# Patient Record
Sex: Female | Born: 1990 | Race: Black or African American | Hispanic: No | Marital: Single | Smoking: Never smoker
Health system: Southern US, Community
[De-identification: ages and names within clinical notes are randomized; demographics above are authoritative.]

## PROBLEM LIST (undated history)

## (undated) ENCOUNTER — Emergency Department (HOSPITAL_COMMUNITY): Admission: EM | Payer: Self-pay | Source: Home / Self Care

## (undated) DIAGNOSIS — F32A Depression, unspecified: Secondary | ICD-10-CM

## (undated) DIAGNOSIS — F419 Anxiety disorder, unspecified: Secondary | ICD-10-CM

## (undated) DIAGNOSIS — F329 Major depressive disorder, single episode, unspecified: Secondary | ICD-10-CM

---
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
## 2011-04-08 ENCOUNTER — Encounter: Payer: Self-pay | Admitting: Family Medicine

## 2011-04-21 ENCOUNTER — Ambulatory Visit (INDEPENDENT_AMBULATORY_CARE_PROVIDER_SITE_OTHER): Payer: Medicaid Other | Admitting: Family Medicine

## 2011-04-21 ENCOUNTER — Encounter: Payer: Self-pay | Admitting: Family Medicine

## 2011-04-21 DIAGNOSIS — Z00129 Encounter for routine child health examination without abnormal findings: Secondary | ICD-10-CM

## 2011-04-21 DIAGNOSIS — Z0000 Encounter for general adult medical examination without abnormal findings: Secondary | ICD-10-CM

## 2011-04-21 DIAGNOSIS — Z23 Encounter for immunization: Secondary | ICD-10-CM

## 2011-04-21 NOTE — Progress Notes (Signed)
Addended by: Farrell Ours on: 04/21/2011 05:51 PM   Modules accepted: Orders, SmartSet
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
## 2011-04-21 NOTE — Assessment & Plan Note (Signed)
She can try supplementing meals with high protein shakes, but would avoid pills or medications to stimulate appetite. Advised patient to schedule appointment with me to discuss mood/depression as needed. Follow up annual adult physical in 12 months.

## 2011-04-29 LAB — URINALYSIS, ROUTINE W REFLEX MICROSCOPIC
Bilirubin Urine: NEGATIVE
Glucose, UA: NEGATIVE
Ketones, ur: NEGATIVE
Protein, ur: NEGATIVE
pH: 5

## 2011-04-29 LAB — CBC
HCT: 34.5 — ABNORMAL LOW
Hemoglobin: 10.7 — ABNORMAL LOW
Platelets: 331
RBC: 4.43
WBC: 8.7

## 2011-04-29 LAB — URINE MICROSCOPIC-ADD ON

## 2011-04-29 LAB — WET PREP, GENITAL
Trich, Wet Prep: NONE SEEN
Yeast Wet Prep HPF POC: NONE SEEN

## 2011-04-29 LAB — APTT: aPTT: 32

## 2011-04-29 LAB — SAMPLE TO BLOOD BANK

## 2011-04-29 LAB — GC/CHLAMYDIA PROBE AMP, GENITAL: Chlamydia, DNA Probe: NEGATIVE

## 2011-07-25 ENCOUNTER — Emergency Department (HOSPITAL_COMMUNITY): Payer: No Typology Code available for payment source

## 2011-07-25 ENCOUNTER — Emergency Department (HOSPITAL_COMMUNITY)
Admission: EM | Admit: 2011-07-25 | Discharge: 2011-07-25 | Disposition: A | Discharge status: Home / Self Care | Payer: No Typology Code available for payment source | Attending: Emergency Medicine | Admitting: Emergency Medicine

## 2011-07-25 ENCOUNTER — Encounter (HOSPITAL_COMMUNITY): Payer: Self-pay | Admitting: *Deleted

## 2011-07-25 DIAGNOSIS — S39012A Strain of muscle, fascia and tendon of lower back, initial encounter: Secondary | ICD-10-CM

## 2011-07-25 DIAGNOSIS — M542 Cervicalgia: Secondary | ICD-10-CM | POA: Insufficient documentation

## 2011-07-25 DIAGNOSIS — M545 Low back pain, unspecified: Secondary | ICD-10-CM | POA: Insufficient documentation

## 2011-07-25 DIAGNOSIS — S139XXA Sprain of joints and ligaments of unspecified parts of neck, initial encounter: Secondary | ICD-10-CM | POA: Insufficient documentation

## 2011-07-25 DIAGNOSIS — S335XXA Sprain of ligaments of lumbar spine, initial encounter: Secondary | ICD-10-CM | POA: Insufficient documentation

## 2011-07-25 DIAGNOSIS — S161XXA Strain of muscle, fascia and tendon at neck level, initial encounter: Secondary | ICD-10-CM

## 2011-07-25 MED ORDER — IBUPROFEN 800 MG PO TABS
800.0000 mg | ORAL_TABLET | Freq: Once | ORAL | Status: AC
Start: 2011-07-25 — End: 2011-07-25
  Administered 2011-07-25: 800 mg via ORAL
  Filled 2011-07-25: qty 1

## 2011-07-25 MED ORDER — CYCLOBENZAPRINE HCL 10 MG PO TABS: 10.0000 mg | ORAL_TABLET | Freq: Two times a day (BID) | ORAL | Status: AC | PRN

## 2011-07-25 MED ORDER — HYDROCODONE-ACETAMINOPHEN 5-325 MG PO TABS: 2.0000 | ORAL_TABLET | ORAL | Status: AC | PRN
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
## 2011-07-25 NOTE — ED Notes (Signed)
Transported to Radiology in wheelchair by tech.

## 2011-07-25 NOTE — ED Notes (Signed)
C Collar removed after Radiology results reviewed.

## 2011-07-25 NOTE — ED Notes (Signed)
Returned to room.
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
## 2011-07-26 NOTE — ED Provider Notes (Signed)
Medical screening examination/treatment/procedure(s) were conducted as a shared visit with non-physician practitioner(s) and myself.  I personally evaluated the patient during the encounter   Jaime Octave, MD 07/26/11 1036

## 2012-05-09 ENCOUNTER — Emergency Department (HOSPITAL_COMMUNITY)
Admission: EM | Admit: 2012-05-09 | Discharge: 2012-05-10 | Disposition: A | Discharge status: Home / Self Care | Payer: Self-pay | Attending: Emergency Medicine | Admitting: Emergency Medicine

## 2012-05-09 ENCOUNTER — Encounter (HOSPITAL_COMMUNITY): Payer: Self-pay

## 2012-05-09 ENCOUNTER — Emergency Department (HOSPITAL_COMMUNITY): Payer: Self-pay

## 2012-05-09 DIAGNOSIS — R079 Chest pain, unspecified: Secondary | ICD-10-CM | POA: Insufficient documentation

## 2012-05-09 DIAGNOSIS — R002 Palpitations: Secondary | ICD-10-CM | POA: Insufficient documentation

## 2012-05-09 LAB — PREGNANCY, URINE: Preg Test, Ur: NEGATIVE

## 2012-05-09 NOTE — ED Notes (Signed)
Per EMS, pt c/o of "heart hurting" with palpitations and SOB. When EMS was putting EKG leads on again, pt reports palpitations again, EMS noted no change in heart rate or EKG.

## 2012-05-09 NOTE — ED Notes (Signed)
Pt. Reports chest palpitations and her "heart hurting" with associated SOB and dizziness. Pt. States intermittently happens the past couple months. Pt currently denies pain or palpitations. Radial pulses strong.
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
## 2012-05-10 NOTE — Discharge Instructions (Signed)
Chest Pain, Nonspecific It is often hard to give a specific diagnosis for the cause of chest pain. There is always a chance that your pain could be related to something serious, like a heart attack or a blood clot in the lungs. You need to follow up with your caregiver for further evaluation. More lab tests or other studies such as X-rays, electrocardiography, stress testing, or cardiac imaging may be needed to find the cause of your pain. Most of the time, nonspecific chest pain improves within 2 to 3 days with rest and mild pain medicine. For the next few days, avoid physical exertion or activities that bring on pain. Do not smoke. Avoid drinking alcohol. Call your caregiver for routine follow-up as advised.  SEEK IMMEDIATE MEDICAL CARE IF:  You develop increased chest pain or pain that radiates to the arm, neck, jaw, back, or abdomen.  You develop shortness of breath, increased coughing, or you start coughing up blood.  You have severe back or abdominal pain, nausea, or vomiting.  You develop severe weakness, fainting, fever, or chills. Document Released: 07/18/2005 Document Revised: 10/10/2011 Document Reviewed: 01/05/2007 [facility] Patient Information 2013 [place], [place]. Palpitations  A palpitation is the feeling that your heartbeat is irregular or is faster than normal. It may feel like your heart is fluttering or skipping a beat. Palpitations are usually not a serious problem. However, in some cases, you may need further medical evaluation. CAUSES  Palpitations can be caused by:  Smoking.  Caffeine or other stimulants, such as diet pills or energy drinks.  Alcohol.  Stress and anxiety.  Strenuous physical activity.  Fatigue.  Certain medicines.  Heart disease, especially if you have a history of arrhythmias. This includes atrial fibrillation, atrial flutter, or supraventricular tachycardia.  An improperly working pacemaker or defibrillator. DIAGNOSIS  To find the cause of  your palpitations, your caregiver will take your history and perform a physical exam. Tests may also be done, including:  Electrocardiography (ECG). This test records the heart's electrical activity.  Cardiac monitoring. This allows your caregiver to monitor your heart rate and rhythm in real time.  Holter monitor. This is a portable device that records your heartbeat and can help diagnose heart arrhythmias. It allows your caregiver to track your heart activity for several days, if needed.  Stress tests by exercise or by giving medicine that makes the heart beat faster. TREATMENT  Treatment of palpitations depends on the cause of your symptoms and can vary greatly. Most cases of palpitations do not require any treatment other than time, relaxation, and monitoring your symptoms. Other causes, such as atrial fibrillation, atrial flutter, or supraventricular tachycardia, usually require further treatment. HOME CARE INSTRUCTIONS   Avoid:  Caffeinated coffee, tea, soft drinks, diet pills, and energy drinks.  Chocolate.  Alcohol.  Stop smoking if you smoke.  Reduce your stress and anxiety. Things that can help you relax include:  A method that measures bodily functions so you can learn to control them (biofeedback).  Yoga.  Meditation.  Physical activity such as swimming, jogging, or walking.  Get plenty of rest and sleep. SEEK MEDICAL CARE IF:   You continue to have a fast or irregular heartbeat beyond 24 hours.  Your palpitations occur more often. SEEK IMMEDIATE MEDICAL CARE IF:  You develop chest pain or shortness of breath.  You have a severe headache.  You feel dizzy, or you faint. MAKE SURE YOU:  Understand these instructions.  Will watch your condition.  Will get help right  away if you are not doing well or get worse. Document Released: 07/15/2000 Document Revised: 01/17/2012 Document Reviewed: 09/16/2011 [facility] Patient Information 2013 [place],  [place].  RESOURCE GUIDE  Chronic Pain Problems: Contact [facility]  [phone] Patients need to be referred by their primary care doctor.  Insufficient Money for Medicine: Contact United Way:  call "211" or [facility] (503) 210-7500.  No Primary Care Doctor: - Call [facility]  [phone] - can help you locate a primary care doctor that  accepts your insurance, provides certain services, etc. - Physician Referral Service- [phone]  Agencies that provide inexpensive medical care: - [facility]  [phone] - [facility]  [phone] - [facility]  [phone] - Women's Clinic  [phone] - Planned Parenthood  [phone] [facility]  [phone]  Medicaid-accepting [facility] Providers: - [facility]- [address]  [phone], Mon-Fri 9am-7pm, Sat 9am-1pm - [facility]- [address] [place], [address] [place]  [phone] - [facility]- [address], [address] [place]  [phone] [facility]- [address]  [phone] - Renaye Rakers- [address] [place], [address], [phone]  Only accepts [place] patients after they have their name  applied to their card  Self Pay (no insurance) in [place]: - Sickle Cell Patients: Dr Willey Blade, [facility]  [address] [place], [phone] - [facility]- [address] [place]  [phone]       [facility]- [address]       -     [facility]- see information above (Speak to [employer] if you do not have insurance)       -  [facility]- [address] [place], [phone]       -  [facility]- [address],  [phone]       -  [facility]- [address] [phone]       -  Dr Julio Sicks-  [address], [place], [phone]       -  [facility]-  [address], [phone]       -  [facility]- [address], [phone], also [address] [phone]       -    [facility]- [address] [place], [phone], 1st & 3rd Saturday   every month, 10am-1pm  1) Find a Doctor and Pay Out of Pocket Although you won't have to find out who is covered by your insurance plan, it is a good idea to ask around and get recommendations. You will then need to call the office and see if the doctor you have chosen will accept you as a new patient and what types of options they offer for patients who are self-pay. Some doctors offer discounts or will set up payment plans for their patients who do not have insurance, but you will need to ask so you aren't surprised when you get to your appointment.  2) Contact Your Local Health Department Not all health departments have doctors that can see patients for sick visits, but many do, so it is worth a call to see if yours does. If you don't know where your local health department is,  you can check in your phone book. The CDC also has a tool to help you locate your state's health department, and many state websites also have listings of all of their local health departments.  3) Find a Walk-in Clinic If your illness is not likely to be very severe or complicated, you may want to try a walk in clinic. These are popping up all over the country in pharmacies, drugstores, and shopping centers. They're usually staffed by nurse practitioners or physician assistants that have been trained to treat common illnesses and complaints. They're usually fairly quick and inexpensive. However, if you have serious medical issues or chronic medical problems, these are probably not your best option  STD Testing - [facility] Department of [facility], STD Clinic, [address] [place], phone [phone] or [phone].  Monday - Friday, call for an appointment. [facility]  Department of [employer], STD Clinic, [address], [place], phone [phone] or [phone].  Monday - Friday, call for an appointment.  Abuse/Neglect: [facility] Child Abuse Hotline 626-560-8094 [facility] Child Abuse Hotline 989-149-1093 (After Hours)  Emergency Shelter:  [facility] 8540509712  Maternity Homes: - [facility] (580) 470-6022 - [facility] [phone]  MRSA Hotline #:   (606)109-6301  [facility] Resources  [facility] [place]  United Way [facility] Dept. [address]                 [address]         [address] [place] [address]  Blondell Reveal Phone:  [phone]                                  Phone:  [phone]                   Phone:  [phone]  [facility], [phone] - [phone]       -     [facility] in [place], [address]                                  ([phone], [facility] Child Abuse Hotline 830 625 2608 or 431-709-8010 (After Hours)   Behavioral Health Services  Substance Abuse Resources: - [facility] Drug Services  (256)184-7392 - [facility] [phone] - [facility] 4783216752 Floydene Flock [phone] - Residential & Outpatient Substance Abuse Program  606-563-4818  Psychological Services: [facility]  8638488662 Services  [phone] - [facility], [phone] [place]. [address], [place], ACCESS LINE: 787-389-9087 or [phone], [employer]  Dental Assistance  If unable to pay or uninsured, contact:  [facility] or [facility]. to become qualified for  the adult dental  clinic.  Patients with Medicaid: [facility] [phone] [address], [phone] [address] [phone]  If unable to pay, or uninsured, contact [facility] [phone]) or [facility] Department [phone] in [place], [phone] in [facility]) to become qualified for the adult dental clinic  Other Low-Cost Community Dental Services: - [facility]- [address] [place], [place], [place], [address], [phone], [phone], 2nd and 4th Thursday of the month at 6:30am.  10 clients each day by appointment, can sometimes see walk-in patients if someone does not show for an appointment. [facility]- [address] [place], [place], [address], [phone] - [facility]- [address], [place], [place], [address], [phone] - [facility]- [phone] [facility]- [phone] [facility] Department- [phone]

## 2013-01-07 IMAGING — CR DG CHEST 2V
2 series · 2 of 2 positions shown · non-contrast
Comparison: 05/24/2010.

CLINICAL DATA: 21-year-old female right chest pain.

CHEST - 2 VIEW

[w chest pa]
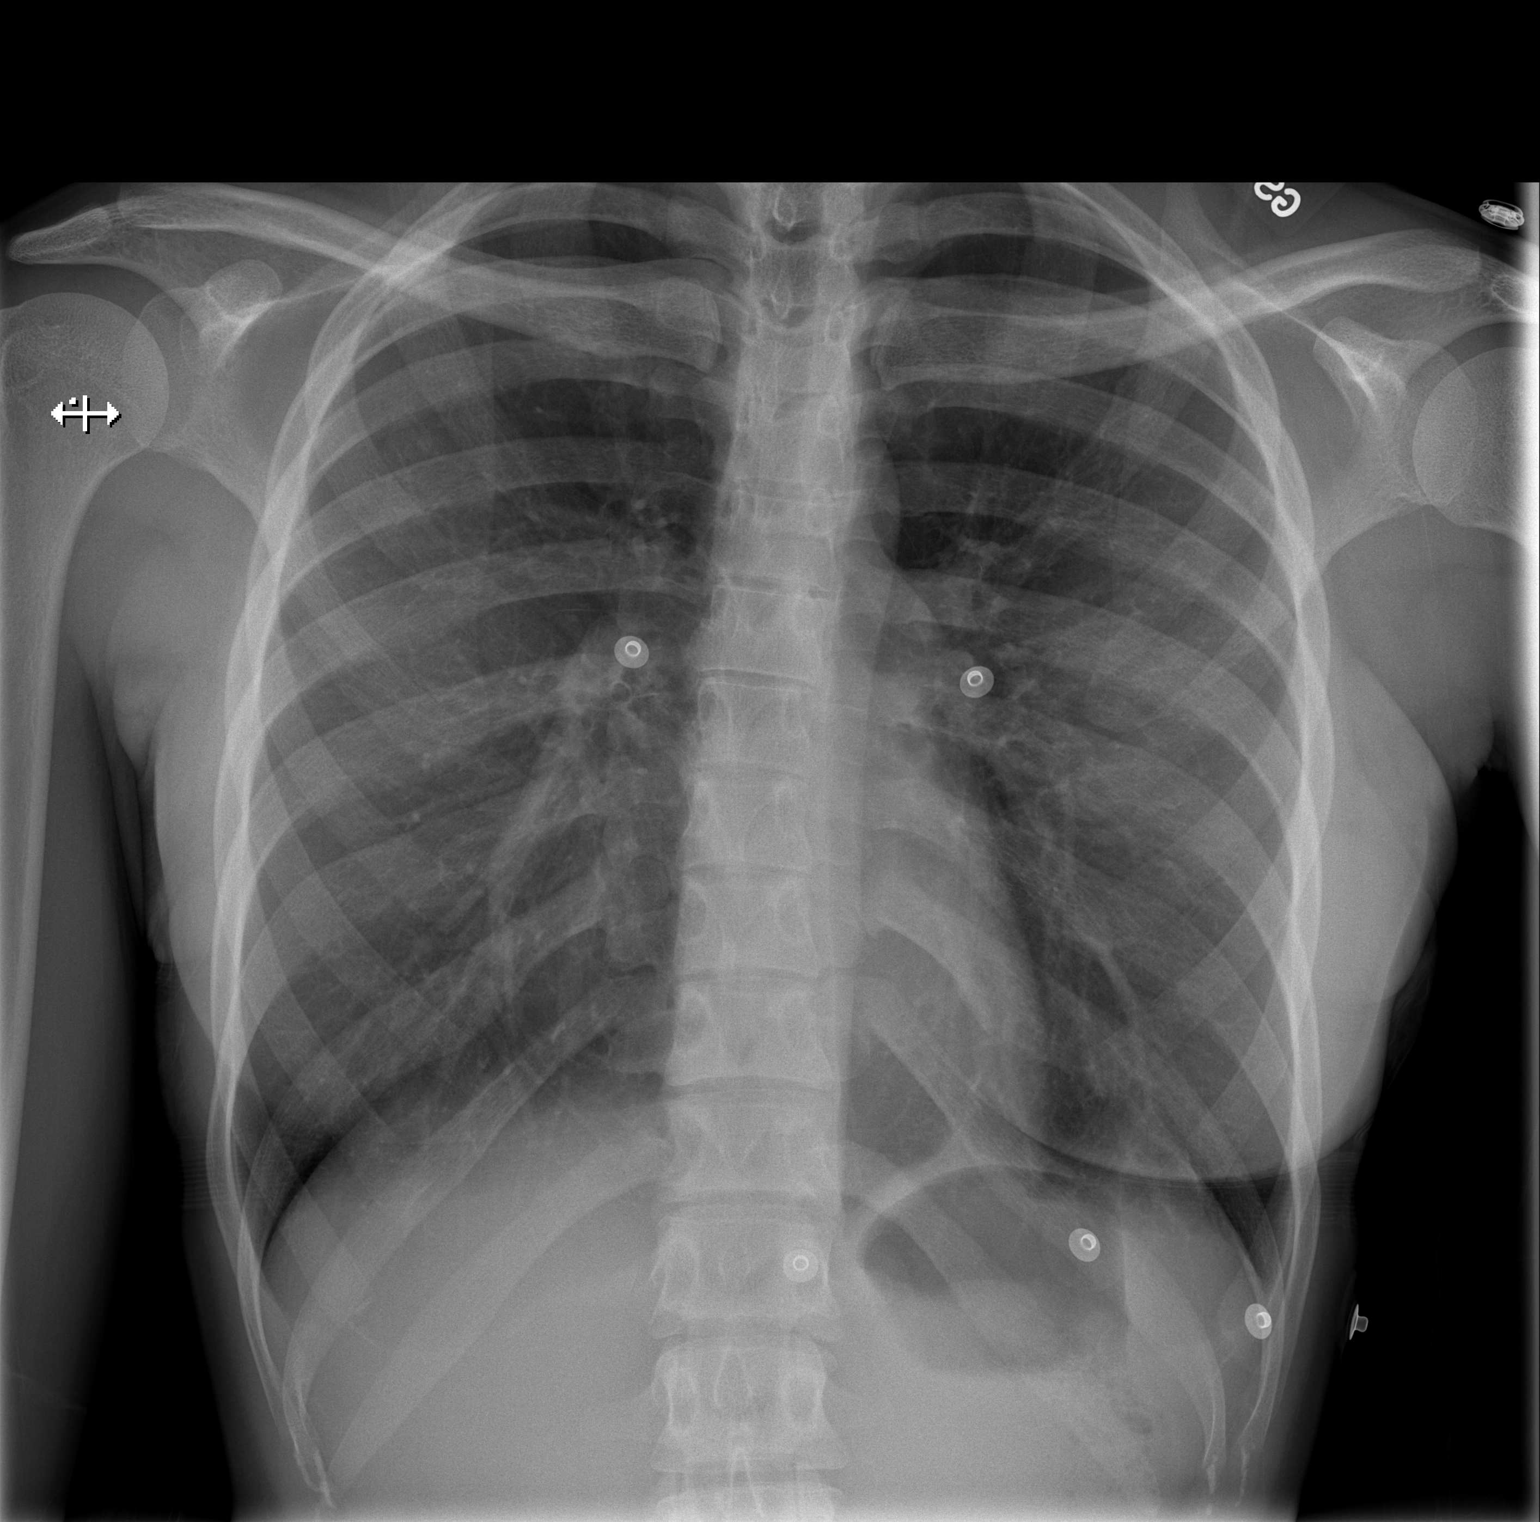

[w chest lat]
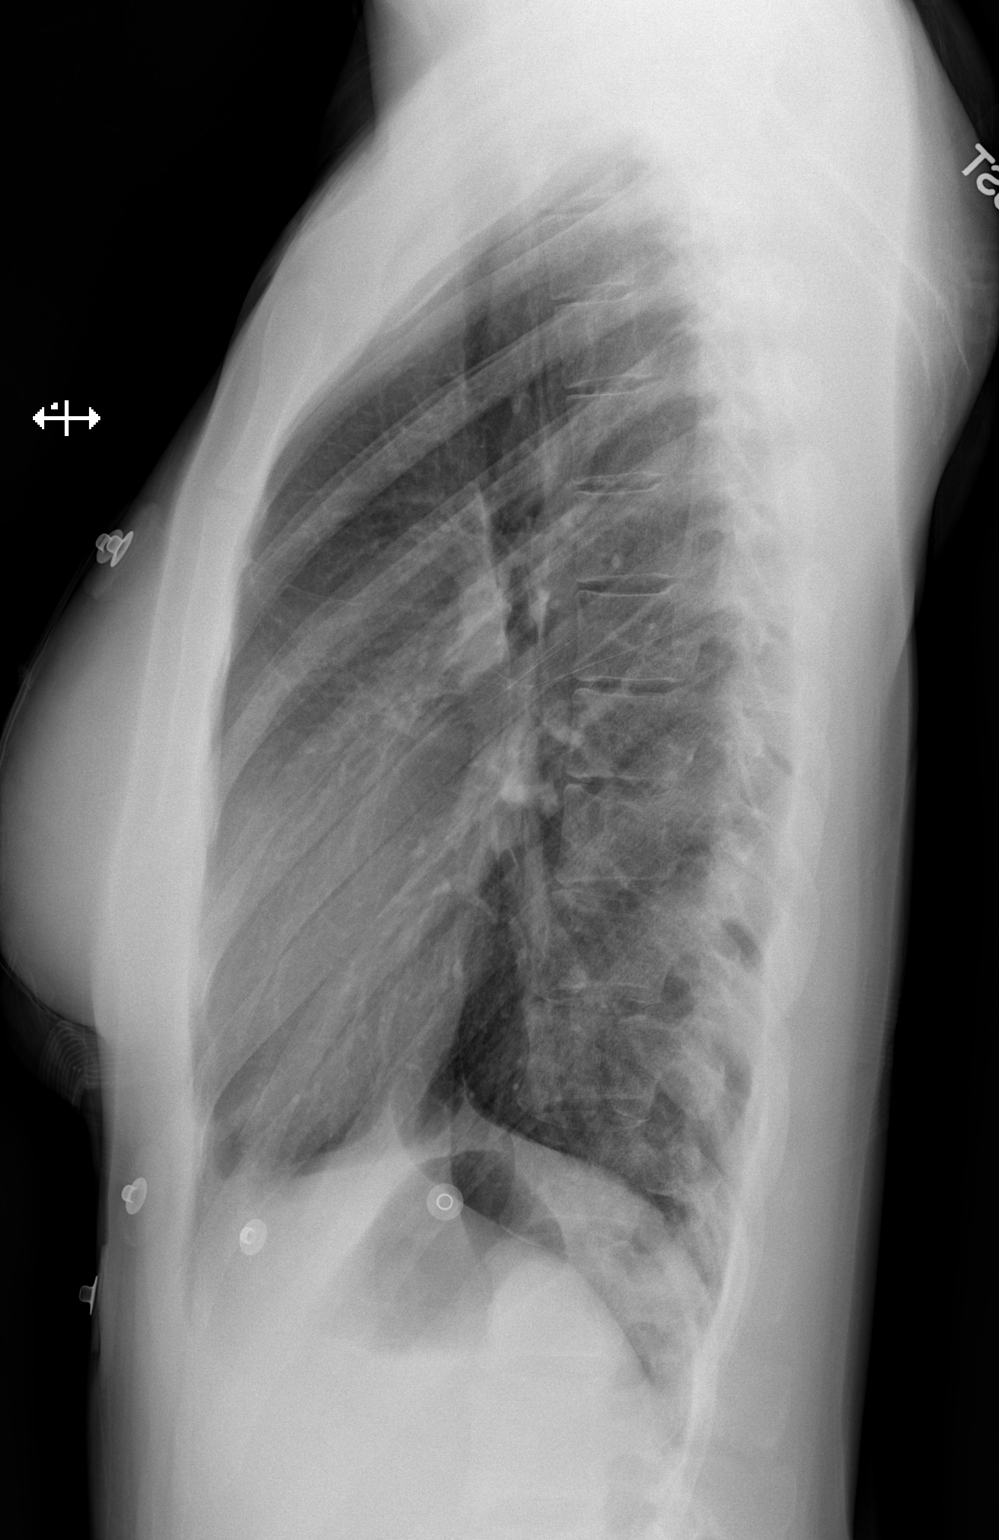

[2 of 2 positions shown; findings below may reference images not displayed]

FINDINGS: Lung volumes are stable and within normal limits. Normal
cardiac size and mediastinal contours.  Visualized tracheal air
column is within normal limits.  No pneumothorax, pulmonary edema,
pleural effusion or confluent pulmonary opacity.
There is suggestion of diffuse increased interstitial opacity which
is new from prior.  No osseous abnormality identified.
IMPRESSION: Bilateral increased interstitial markings which could relate to
viral / atypical respiratory infection.  Otherwise no acute
cardiopulmonary abnormality.

## 2014-03-02 ENCOUNTER — Encounter (HOSPITAL_COMMUNITY): Payer: Self-pay | Admitting: Emergency Medicine

## 2014-03-02 ENCOUNTER — Emergency Department (HOSPITAL_COMMUNITY)
Admission: EM | Admit: 2014-03-02 | Discharge: 2014-03-02 | Disposition: A | Discharge status: Home / Self Care | Payer: Self-pay | Attending: Emergency Medicine | Admitting: Emergency Medicine

## 2014-03-02 DIAGNOSIS — F411 Generalized anxiety disorder: Secondary | ICD-10-CM | POA: Insufficient documentation

## 2014-03-02 DIAGNOSIS — F10229 Alcohol dependence with intoxication, unspecified: Secondary | ICD-10-CM | POA: Insufficient documentation

## 2014-03-02 DIAGNOSIS — Z3202 Encounter for pregnancy test, result negative: Secondary | ICD-10-CM | POA: Insufficient documentation

## 2014-03-02 DIAGNOSIS — R110 Nausea: Secondary | ICD-10-CM | POA: Insufficient documentation

## 2014-03-02 DIAGNOSIS — F410 Panic disorder [episodic paroxysmal anxiety] without agoraphobia: Secondary | ICD-10-CM | POA: Insufficient documentation

## 2014-03-02 DIAGNOSIS — F8089 Other developmental disorders of speech and language: Secondary | ICD-10-CM | POA: Insufficient documentation

## 2014-03-02 DIAGNOSIS — F10920 Alcohol use, unspecified with intoxication, uncomplicated: Secondary | ICD-10-CM

## 2014-03-02 HISTORY — DX: Depression, unspecified: F32.A

## 2014-03-02 HISTORY — DX: Major depressive disorder, single episode, unspecified: F32.9

## 2014-03-02 HISTORY — DX: Anxiety disorder, unspecified: F41.9

## 2014-03-02 LAB — URINALYSIS, ROUTINE W REFLEX MICROSCOPIC
Bilirubin Urine: NEGATIVE
GLUCOSE, UA: NEGATIVE mg/dL
Hgb urine dipstick: NEGATIVE
KETONES UR: NEGATIVE mg/dL
LEUKOCYTES UA: NEGATIVE
Nitrite: NEGATIVE
Protein, ur: NEGATIVE mg/dL
Specific Gravity, Urine: 1.01 (ref 1.005–1.030)
Urobilinogen, UA: 1 mg/dL (ref 0.0–1.0)
pH: 6 (ref 5.0–8.0)

## 2014-03-02 LAB — ETHANOL: ALCOHOL ETHYL (B): 194 mg/dL — AB (ref 0–11)

## 2014-03-02 LAB — POC URINE PREG, ED: PREG TEST UR: NEGATIVE

## 2014-03-02 MED ORDER — SODIUM CHLORIDE 0.9 % IV BOLUS (SEPSIS)
1000.0000 mL | Freq: Once | INTRAVENOUS | Status: AC
  Administered 2014-03-02: 1000 mL via INTRAVENOUS

## 2014-03-02 NOTE — ED Notes (Signed)
Pt.'s mother; Jacklynn GanongBrenda Freeman is aware of pt.'s discharge, on her way to pick up pt.

## 2014-03-02 NOTE — Discharge Instructions (Signed)
These drink alcohol only in moderation  Return here for any concerning changes in your condition.   Alcohol Intoxication Alcohol intoxication occurs when the amount of alcohol that a person has consumed impairs his or her ability to mentally and physically function. Alcohol directly impairs the normal chemical activity of the brain. Drinking large amounts of alcohol can lead to changes in mental function and behavior, and it can cause many physical effects that can be harmful.  Alcohol intoxication can range in severity from mild to very severe. Various factors can affect the level of intoxication that occurs, such as the person's age, gender, weight, frequency of alcohol consumption, and the presence of other medical conditions (such as diabetes, seizures, or heart conditions). Dangerous levels of alcohol intoxication may occur when people drink large amounts of alcohol in a short period (binge drinking). Alcohol can also be especially dangerous when combined with certain prescription medicines or "recreational" drugs. SIGNS AND SYMPTOMS Some common signs and symptoms of mild alcohol intoxication include:  Loss of coordination.  Changes in mood and behavior.  Impaired judgment.  Slurred speech. As alcohol intoxication progresses to more severe levels, other signs and symptoms will appear. These may include:  Vomiting.  Confusion and impaired memory.  Slowed breathing.  Seizures.  Loss of consciousness. DIAGNOSIS  Your health care provider will take a medical history and perform a physical exam. You will be asked about the amount and type of alcohol you have consumed. Blood tests will be done to measure the concentration of alcohol in your blood. In many places, your blood alcohol level must be lower than 80 mg/dL (1.61%0.08%) to legally drive. However, many dangerous effects of alcohol can occur at much lower levels.  TREATMENT  People with alcohol intoxication often do not require  treatment. Most of the effects of alcohol intoxication are temporary, and they go away as the alcohol naturally leaves the body. Your health care provider will monitor your condition until you are stable enough to go home. Fluids are sometimes given through an IV access tube to help prevent dehydration.  HOME CARE INSTRUCTIONS  Do not drive after drinking alcohol.  Stay hydrated. Drink enough water and fluids to keep your urine clear or pale yellow. Avoid caffeine.   Only take over-the-counter or prescription medicines as directed by your health care provider.  SEEK MEDICAL CARE IF:   You have persistent vomiting.   You do not feel better after a few days.  You have frequent alcohol intoxication. Your health care provider can help determine if you should see a substance use treatment counselor. SEEK IMMEDIATE MEDICAL CARE IF:   You become shaky or tremble when you try to stop drinking.   You shake uncontrollably (seizure).   You throw up (vomit) blood. This may be bright red or may look like black coffee grounds.   You have blood in your stool. This may be bright red or may appear as a black, tarry, bad smelling stool.   You become lightheaded or faint.  MAKE SURE YOU:   Understand these instructions.  Will watch your condition.  Will get help right away if you are not doing well or get worse. Document Released: 04/27/2005 Document Revised: 03/20/2013 Document Reviewed: 12/21/2012 [facility] Patient Information 2015 [place]. This information is not intended to replace advice given to you by your health care provider. Make sure you discuss any questions you have with your health care provider.

## 2014-03-02 NOTE — ED Notes (Signed)
Per EMS, pt. From home with complaint of anxiety attack as she came home from a party this morning, pt. Denies pain , no SOB. Alert and oriented x3, nauseous, has hx of depression.

## 2014-03-02 NOTE — ED Notes (Signed)
Bed: [id number] Expected date:  Expected time:  Means of arrival:  Comments: EMS 23yo anxiety, panic attack

## 2014-03-02 NOTE — ED Provider Notes (Signed)
CSN: [id number]     Arrival date & time 03/02/14  0413 History   First MD Initiated Contact with Patient 03/02/14 [phone]     Chief Complaint  Patient presents with  . Panic Attack     (Consider location/radiation/quality/duration/timing/severity/associated sxs/prior Treatment) HPI Patient presents with concern of increasing anxiety. She states that she was at a party, and drank substantial amounts of warm, and became anxious, nauseous. She denies pain, continues to complain of nausea. Evette CristalShin has a history of depression, but states that she has no suicidal ideation, nor any active depressive symptoms. Patient is clinically intoxicated, level V caveat there Past Medical History  Diagnosis Date  . Depression   . Anxiety    History reviewed. No pertinent past surgical history. History reviewed. No pertinent family history. History  Substance Use Topics  . Smoking status: Never Smoker   . Smokeless tobacco: Not on file  . Alcohol Use: Yes     Comment: occasional   OB History   Grav Para Term Preterm Abortions TAB SAB Ect Mult Living                 Review of Systems  Unable to perform ROS: Other  HPI largely reliable, but the inebriation is limiting    Allergies  Review of patient's allergies indicates no known allergies.  Home Medications   Prior to Admission medications   Not on File   BP 108/61  Pulse 89  Temp(Src) 98.1 F (36.7 C) (Oral)  Resp 18  Ht 5\' 7"  (1.702 m)  Wt 140 lb (63.504 kg)  BMI 21.92 kg/m2  SpO2 99%  LMP 01/03/2014 Physical Exam  Nursing note and vitals reviewed. Constitutional: She is oriented to person, place, and time. She appears well-developed and well-nourished. No distress.  HENT:  Head: Normocephalic and atraumatic.  Eyes: Conjunctivae and EOM are normal.  Cardiovascular: Normal rate and regular rhythm.   Pulmonary/Chest: Effort normal and breath sounds normal. No stridor. No respiratory distress.  Abdominal: She exhibits no  distension.  Musculoskeletal: She exhibits no edema.  Neurological: She is alert and oriented to person, place, and time. No cranial nerve deficit.  Skin: Skin is warm and dry.  Psychiatric: Her speech is delayed. She is slowed and withdrawn. Cognition and memory are impaired.    ED Course  Procedures (including critical care time) Labs Review Labs Reviewed  ETHANOL - Abnormal; Notable for the following:    Alcohol, Ethyl (B) 194 (*)    All other components within normal limits  URINALYSIS, ROUTINE W REFLEX MICROSCOPIC  POC URINE PREG, ED    6:23 AM Patient sleeping soundly labs notable for elevated alcohol level MDM  This generally well young female presents with acute alcohol intoxication.  Patient is moving all extremities spontaneously, and after a period of nausea on arrival, is in no distress, with no evidence of trauma, fall, other acute processes. After a period of monitoring, the patient was discharged with companions.   Gerhard Munchobert Mylene Bow, MD 03/03/14 [phone]

## 2014-03-03 ENCOUNTER — Encounter (HOSPITAL_COMMUNITY): Payer: Self-pay

## 2015-03-08 ENCOUNTER — Emergency Department (HOSPITAL_COMMUNITY)
Admission: EM | Admit: 2015-03-08 | Discharge: 2015-03-08 | Disposition: A | Discharge status: Home / Self Care | Payer: Self-pay | Attending: Emergency Medicine | Admitting: Emergency Medicine

## 2015-03-08 ENCOUNTER — Encounter (HOSPITAL_COMMUNITY): Payer: Self-pay | Admitting: Emergency Medicine

## 2015-03-08 DIAGNOSIS — R55 Syncope and collapse: Secondary | ICD-10-CM | POA: Insufficient documentation

## 2015-03-08 DIAGNOSIS — F329 Major depressive disorder, single episode, unspecified: Secondary | ICD-10-CM | POA: Insufficient documentation

## 2015-03-08 DIAGNOSIS — F430 Acute stress reaction: Secondary | ICD-10-CM

## 2015-03-08 DIAGNOSIS — Z79899 Other long term (current) drug therapy: Secondary | ICD-10-CM | POA: Insufficient documentation

## 2015-03-08 DIAGNOSIS — F410 Panic disorder [episodic paroxysmal anxiety] without agoraphobia: Secondary | ICD-10-CM | POA: Insufficient documentation

## 2015-03-08 NOTE — ED Provider Notes (Signed)
CSN: [id number]     Arrival date & time 03/08/15  0400 History   First MD Initiated Contact with Patient 03/08/15 0435     Chief Complaint  Patient presents with  . Anxiety     (Consider location/radiation/quality/duration/timing/severity/associated sxs/prior Treatment) HPI   24 year old female with history of panic attack, anxiety, depression, brought here via EMS for evaluation of an anxiety attack.  Per EMS patient became anxious after a verbal argument with her ex-boyfriend when she hyperventilated, fell hitting her head on the back of a car while she was in the parking lot and had a syncopal episode. Patient report she was at a party earlier in the night. She admits that she drank 3 small beer. She was very upset after an argument with her ex-boyfriend and subsequent syncopized. This has happened in the past. A security guard was at the scene and contact EMS which brought patient here to the ER. At this time patient admits that she is still a bit upset but she feels much better. She denies any significant headache, difficulty thinking, tongue biting, double vision, neck pain, shoulder pain, back pain, focal numbness or weakness, nausea or vomiting. History of depression and was placed on Zoloft but has not been compliant. She also denies any chest pain or heart palpitation. She denies any drug use. She is an occasional drinker. Patient's mom is at bedside.  Past Medical History  Diagnosis Date  . Depression   . Anxiety    History reviewed. No pertinent past surgical history. No family history on file. History  Substance Use Topics  . Smoking status: Never Smoker   . Smokeless tobacco: Not on file  . Alcohol Use: Yes     Comment: occasional   OB History    No data available     Review of Systems  All other systems reviewed and are negative.     Allergies  Review of patient's allergies indicates no known allergies.  Home Medications   Prior to Admission medications    Medication Sig Start Date End Date Taking? Authorizing Provider  sertraline (ZOLOFT) 50 MG tablet Take 50 mg by mouth daily.   Yes Historical Provider, MD  traZODone (DESYREL) 50 MG tablet Take 50 mg by mouth at bedtime as needed for sleep.   Yes Historical Provider, MD  albuterol (PROVENTIL HFA;VENTOLIN HFA) 108 (90 BASE) MCG/ACT inhaler Inhale 2 puffs into the lungs every 6 (six) hours as needed. For shortness of breath    Historical Provider, MD  ferrous gluconate (FERGON) 325 MG tablet Take 325 mg by mouth 3 (three) times daily with meals.      Historical Provider, MD   BP 104/60 mmHg  Pulse 96  Temp(Src) 97.8 F (36.6 C) (Oral)  Resp 19  SpO2 99%  LMP 02/15/2015 Physical Exam  Constitutional: She is oriented to person, place, and time. She appears well-developed and well-nourished. No distress.  HENT:  Head: Atraumatic.  Right Ear: External ear normal.  Left Ear: External ear normal.  Mouth/Throat: Oropharynx is clear and moist. No oropharyngeal exudate.  No scalp tenderness on palpation no crepitus and no bruising noted. No hemotympanum, no septal hematoma, no malocclusion, no midface tenderness. No raccoons eyes, or Battle sign  Eyes: Conjunctivae are normal.  Neck: Normal range of motion. Neck supple.  No cervical midline spine tenderness.  Cardiovascular: Normal rate and regular rhythm.   Pulmonary/Chest: Effort normal and breath sounds normal.  Abdominal: Soft.  Neurological: She is alert and oriented to  person, place, and time.  Neurologic exam:  Speech clear, pupils equal round reactive to light, extraocular movements intact  Normal peripheral visual fields Cranial nerves III through XII normal including no facial droop Follows commands, moves all extremities x4, normal strength to bilateral upper and lower extremities at all major muscle groups including grip Sensation normal to light touch  Coordination intact, no limb ataxia, finger-nose-finger normal Rapid  alternating movements normal No pronator drift Gait normal   Skin: No rash noted.  Psychiatric: She exhibits a depressed mood.  Patient is calm and cooperative.  Nursing note and vitals reviewed.   ED Course  Procedures (including critical care time)  Patient had an anxiety and panic attack falls with syncopal episode earlier in the night after a verbal altercation with her ex-boyfriend. The syncopal episodes had happened in the past. Suspect vasovagal syncope. She has no focal neuro deficit on exam and no significant signs of injury on exam. She is able to ambulate and clinically sober. Normal orthostatic vital sign. No complaint of heart palpitation. No suspicion for cardiac etiology. Doubts head CT scan will be beneficial as I have low suspicion for intracranial injury and patient agrees. Rectum on follow-up with PCP for further management of her condition. Return precautions discussed.  Labs Review Labs Reviewed  ETHANOL    Imaging Review No results found.   EKG Interpretation None      MDM   Final diagnoses:  Panic attack as reaction to stress  Vasovagal syncope    BP 104/60 mmHg  Pulse 96  Temp(Src) 97.8 F (36.6 C) (Oral)  Resp 19  SpO2 99%  LMP 02/15/2015     Fayrene Helper, PA-C 03/08/15 0504  April Palumbo, MD 03/08/15 [phone]

## 2015-03-08 NOTE — ED Notes (Signed)
Bed: ZO10 Expected date:  Expected time:  Means of arrival:  Comments: Ems Panic attack/fall/loc/hyperventilating

## 2015-03-08 NOTE — ED Notes (Signed)
PA Bowie at bedside  °

## 2015-03-08 NOTE — ED Notes (Signed)
Family visiting at bedside

## 2015-03-08 NOTE — ED Notes (Addendum)
Pt brought in by [facility] c/o anxiety. Per [facility] pt reports that she became anxious after an argument with her ex-boyfriend she hyperventilated, fell and hit head on the back of car (she was in a parking lot). Pt reports having 3 beers. Pt is suppose to take zoloft but is noncompliant. Pt denies Pain.

## 2015-03-08 NOTE — ED Notes (Addendum)
Pt alert,oriented, and ambulatory upon DC. She was provided a Special educational needs teacher and advised to use resource to find a PCP. She verbalizes understanding. Mother was present during discharge and she reports they are unable to pay for a PCP. This RN explains the importance of resource packet and encouraged to use a provider from there or try [employer].

## 2015-03-08 NOTE — Discharge Instructions (Signed)
Vasovagal Syncope, Adult °Syncope, commonly known as fainting, is a temporary loss of consciousness. It occurs when the blood flow to the brain is reduced. Vasovagal syncope (also called neurocardiogenic syncope) is a fainting spell in which the blood flow to the brain is reduced because of a sudden drop in heart rate and blood pressure. Vasovagal syncope occurs when the brain and the cardiovascular system (blood vessels) do not adequately communicate and respond to each other. This is the most common cause of fainting. It often occurs in response to fear or some other type of emotional or physical stress. The body has a reaction in which the heart starts beating too slowly or the blood vessels expand, reducing blood pressure. This type of fainting spell is generally considered harmless. However, injuries can occur if a person takes a sudden fall during a fainting spell.  °CAUSES  °Vasovagal syncope occurs when a person's blood pressure and heart rate decrease suddenly, usually in response to a trigger. Many things and situations can trigger an episode. Some of these include:  °· Pain.   °· Fear.   °· The sight of blood or medical procedures, such as blood being drawn from a vein.   °· Common activities, such as coughing, swallowing, stretching, or going to the bathroom.   °· Emotional stress.   °· Prolonged standing, especially in a warm environment.   °· Lack of sleep or rest.   °· Prolonged lack of food.   °· Prolonged lack of fluids.   °· Recent illness. °· The use of certain drugs that affect blood pressure, such as cocaine, alcohol, marijuana, inhalants, and opiates.   °SYMPTOMS  °Before the fainting episode, you may:  °· Feel dizzy or light headed.   °· Become pale. °· Sense that you are going to faint.   °· Feel like the room is spinning.   °· Have tunnel vision, only seeing directly in front of you.   °· Feel sick to your stomach (nauseous).   °· See spots or slowly lose vision.   °· Hear ringing in your  ears.   °· Have a headache.   °· Feel warm and sweaty.   °· Feel a sensation of pins and needles. °During the fainting spell, you will generally be unconscious for no longer than a couple minutes before waking up and returning to normal. If you get up too quickly before your body can recover, you may faint again. Some twitching or jerky movements may occur during the fainting spell.  °DIAGNOSIS  °Your caregiver will ask about your symptoms, take a medical history, and perform a physical exam. Various tests may be done to rule out other causes of fainting. These may include blood tests and tests to check the heart, such as electrocardiography, echocardiography, and possibly an electrophysiology study. When other causes have been ruled out, a test may be done to check the body's response to changes in position (tilt table test). °TREATMENT  °Most cases of vasovagal syncope do not require treatment. Your caregiver may recommend ways to avoid fainting triggers and may provide home strategies for preventing fainting. If you must be exposed to a possible trigger, you can drink additional fluids to help reduce your chances of having an episode of vasovagal syncope. If you have warning signs of an oncoming episode, you can respond by positioning yourself favorably (lying down). °If your fainting spells continue, you may be given medicines to prevent fainting. Some medicines may help make you more resistant to repeated episodes of vasovagal syncope. Special exercises or compression stockings may be recommended. In rare cases, the surgical placement   of a pacemaker is considered. °HOME CARE INSTRUCTIONS  °· Learn to identify the warning signs of vasovagal syncope.   °· Sit or lie down at the first warning sign of a fainting spell. If sitting, put your head down between your legs. If you lie down, swing your legs up in the air to increase blood flow to the brain.   °· Avoid hot tubs and saunas. °· Avoid prolonged  standing. °· Drink enough fluids to keep your urine clear or pale yellow. Avoid caffeine. °· Increase salt in your diet as directed by your caregiver.   °· If you have to stand for a long time, perform movements such as:   °¨ Crossing your legs.   °¨ Flexing and stretching your leg muscles.   °¨ Squatting.   °¨ Moving your legs.   °¨ Bending over.   °· Only take over-the-counter or prescription medicines as directed by your caregiver. Do not suddenly stop any medicines without asking your caregiver first.  °SEEK MEDICAL CARE IF:  °· Your fainting spells continue or happen more frequently in spite of treatment.   °· You lose consciousness for more than a couple minutes. °· You have fainting spells during or after exercising or after being startled.   °· You have new symptoms that occur with the fainting spells, such as:   °¨ Shortness of breath. °¨ Chest pain.   °¨ Irregular heartbeat.   °· You have episodes of twitching or jerky movements that last longer than a few seconds. °· You have episodes of twitching or jerky movements without obvious fainting. °SEEK IMMEDIATE MEDICAL CARE IF:  °· You have injuries or bleeding after a fainting spell.   °· You have episodes of twitching or jerky movements that last longer than 5 minutes.   °· You have more than one spell of twitching or jerky movements before returning to consciousness after fainting. °MAKE SURE YOU:  °· Understand these instructions. °· Will watch your condition. °· Will get help right away if you are not doing well or get worse. °Document Released: 07/04/2012 Document Reviewed: 07/04/2012 °ExitCare® Patient Information ©2015 [employer]. This information is not intended to replace advice given to you by your health care provider. Make sure you discuss any questions you have with your health care provider. ° ° °Emergency Department Resource Guide °1) Find a Doctor and Pay Out of Pocket °Although you won't have to find out who is covered by your insurance  plan, it is a good idea to ask around and get recommendations. You will then need to call the office and see if the doctor you have chosen will accept you as a new patient and what types of options they offer for patients who are self-pay. Some doctors offer discounts or will set up payment plans for their patients who do not have insurance, but you will need to ask so you aren't surprised when you get to your appointment. ° °2) Contact Your Local Health Department °Not all health departments have doctors that can see patients for sick visits, but many do, so it is worth a call to see if yours does. If you don't know where your local health department is, you can check in your phone book. The CDC also has a tool to help you locate your state's health department, and many state websites also have listings of all of their local health departments. ° °3) Find a Walk-in Clinic °If your illness is not likely to be very severe or complicated, you may want to try a walk in clinic. These are popping up all   over the country in pharmacies, drugstores, and shopping centers. They're usually staffed by nurse practitioners or physician assistants that have been trained to treat common illnesses and complaints. They're usually fairly quick and inexpensive. However, if you have serious medical issues or chronic medical problems, these are probably not your best option. ° °No Primary Care Doctor: °- Call [facility] at  832-8000 - they can help you locate a primary care doctor that  accepts your insurance, provides certain services, etc. °- Physician Referral Service- 1-800-533-3463 ° °Chronic Pain Problems: °Organization         Address  Phone   Notes  °[facility]  (336) 297-2271 Patients need to be referred by their primary care doctor.  ° °Medication Assistance: °Organization         Address  Phone   Notes  °[place] [facility] [address] °[place], [place] [address]  (336) 641-8030 --Must be a resident of [place] °-- Must have NO insurance coverage whatsoever (no Medicaid/ Medicare, etc.) °-- The pt. MUST have a primary care doctor that directs their care regularly and follows them in the community °  °[facility]  (866) 331-1348   °[employer]  (888) 892-1162   ° °Agencies that provide inexpensive medical care: °Organization         Address  Phone   Notes  °[facility]  (336) 832-8035   °[facility]    (336) 832-7272   °[facility] [address] °[place], [place] [address] (336) 832-4777   °[facility] [address], °[place] (336) 271-4999   °[facility]    (336) 373-0678   °[facility]    (336) 272-1050   °[facility] ° [address], [place] Phone:  (336) 832-4444, Fax:  (336) 832-4440 Hours of Operation:  9 am - 6 pm, M-F.  Also accepts Medicaid/Medicare and self-pay.  °[facility] ° [address], [place] Phone: (336) 832-3150, Fax: (336) 832-3151. Hours of Operation:  8:30 am - 5:30 pm, M-F.  Also accepts Medicaid and self-pay.  °[facility] [place] [address], [place] Phone: (336) 878-6027   °[facility] [address], [place], [place] (336)723-1848, Ext. 123 Mondays & Thursdays: 7-9 AM.  First 15 patients are seen on a first come, first serve basis. °  ° °Medicaid-accepting [place] Providers: ° °Organization         Address  Phone   Notes  °[facility] [address], [place] (336) 641-2100 Also accepts self-pay patients.  °[facility] [address], [place] ° (336) 856-9996   °[facility] [address], [place] (336) 288-8857   °[facility] [address] [place] [address], [place] (336) 299-7000   °Veita Bland [address], [place]  ° (336)  373-1557 Only accepts [place] Access Medicaid patients after they have their name applied to their card.  ° °Self-Pay (no insurance) in [place]: ° °Organization         Address  Phone   Notes  °Sickle Cell Patients, [facility] [address], [place] (336) 832-1970   °[facility] [address], [place] (336) 832-4400   ° [facility] ° [address], [place] (336) 992-4800   °[facility]/Dr. Osei-Bonsu ° [address] [place] [address], [place] or [address], [place] (336) 841-8500 Phone number for both [place] and [place] locations is the same.  °[facility] [address], [place] (336) 299-0000   °[facility] [address] [place] [address], [place] or [address] (336) 852-7530 °(336) 878-2260   °[facility] [address], [place] (336) 350-1642, phone; (336) 294-5005, fax Sees patients 1st and 3rd Saturday of every month.  Must not qualify for public or private insurance (i.e. Medicaid, Medicare, [place] Health Choice, Veterans' Benefits) • Household income should be no more than 200% of the poverty level •The clinic cannot treat you if you are pregnant or think you are pregnant • Sexually transmitted diseases are not treated at the clinic.  ° ° °Dental Care: °Organization         Address  Phone  Notes  °[facility] [facility] [address], [place] (336) 641-6152 Accepts children up to age 21 who are enrolled in Medicaid or [place] Health Choice; pregnant women with a Medicaid card; and children who have applied for Medicaid or [place] Health Choice, but were declined, whose parents can pay a reduced fee at time of service.  °[facility] [place]  [address], [place] (336) 641-7733 Accepts children up to age 21 who are enrolled in Medicaid or [place] Health  Choice; pregnant women with a Medicaid card; and children who have applied for Medicaid or [place] Health Choice, but were declined, whose parents can pay a reduced fee at time of service.  °[facility] ° [address], [place] (336) 641-4533 Patients are seen by appointment only. Walk-ins are not accepted. [facility] will see patients 18 years of age and older. °Monday - Tuesday (8am-5pm) °Most Wednesdays (8:30-5pm) °$30 per visit, cash only  °[facility] ° [address], [place] (336) 641-4533 Patients are seen by appointment only. Walk-ins are not accepted. [facility] will see patients 18 years of age and older. °One Wednesday Evening (Monthly: Volunteer Based).  $30 per visit, cash only  °[facility]  (919) 537-3737 for adults; Children under age 4, call Graduate Pediatric Dentistry at (919) [phone]. Children aged 4-14, please call (919) [phone] to request a pediatric application. ° Dental services are provided in all areas of dental care including fillings, crowns and bridges, complete and partial dentures, implants, gum treatment, root canals, and extractions. Preventive care is also provided. Treatment is provided to both adults and children. °Patients are selected via a lottery and there is often a waiting list. °  °[facility] [address], ° ° (336) 763-8833 [email or web address] °  °[facility] [address], [place], [place] (336)723-1848, Ext. 123 Second and Fourth Thursday of each month, opens at 6:30 AM; Clinic ends at 9 AM.  Patients are seen on a first-come first-served basis, and a limited number are seen during each clinic.  ° °[facility] ° [address], [place], [place] (336) 723-7904   Eligibility Requirements °You must have lived in [place], [place], or [place] counties for at least the last three months. °  You cannot be eligible for state or  federal sponsored healthcare insurance, including Veterans Administration, Medicaid, or Medicare. °    You generally cannot be eligible for healthcare insurance through your employer.  °  How to apply: °Eligibility screenings are held every Tuesday and Wednesday afternoon from 1:00 pm until 4:00 pm. You do not need an appointment for the interview!  °[facility] [address], [place], [place] 336-631-2330   °[facility]  336-342-8273   °[facility]  336-703-3100   °[facility]  336-570-6415   ° °Behavioral Health Resources in the Community: °Intensive Outpatient Programs °Organization         Address  Phone  Notes  °[facility] [address], [place], [place] 336-878-6098   °[facility] [address], [place], [place] 336-832-9800   °[facility] [address], [place], [place] ° 336-882-2125   °[facility] [address],  °[place], [place] 1-800-853-5163 or 336-641-4981   °Substance Abuse Resources °Organization         Address  Phone  Notes  °[facility]  336-882-2125   °[facility]  336-784-9470   °[facility]  336-285-9073   °[facility]  336-845-3988   °Residential & Outpatient Substance Abuse Program  1-800-659-3381   °Psychological Services °Organization         Address  Phone  Notes  °[facility]  336- 832-9600   °[facility]  336- 378-7881   °[facility] [address], [place] 1-800-853-5163 or 336-641-4981   ° °Mobile Crisis Teams °Organization         Address  Phone  Notes  °[facility], Mobile Crisis Care Unit  1-877-626-1772   °Assertive °[facility] ° [address] [place], [place] 336-834-9664   °Sharon DeEsch [address] °[place] 336-554-5454   ° °Self-Help/Support Groups °Organization         Address  Phone              Notes  °[facility] - variety of support groups  336- 373-1402 Call for more information  °Narcotics Anonymous (NA), [facility] [address], °[place]  2 meetings at this location  ° °Residential Treatment Programs °Organization         Address  Phone  Notes  °[facility] [address],    °[place]  1-866-801-8205   °[facility] ° [address], [place], [place] 704-293-8524   °[facility] [address], [place] 336-845-3988 Admissions: 8am-3pm M-F  °[facility] [address]    °[place], [place] 336-841-1104   °[facility] [address], [place], [place] 336-379-7146   °[facility] [address]  °[place], [place] 336-285-9073   °[facility] - Intensive Outpatient [address], , [place] 336-852-3033   °[facility] [address]  °[place], [place] 1-877-615-2722 or 336-784-9470   °[facility] [address] [place], [place] 336-227-7417 Accepts Medicaid  °[facility] [address]  °  1-800-659-3381 Substance Abuse/Addiction Treatment  ° °[place] Behavioral Health Resources °Organization         Address  Phone  Notes  °[facility]  (888) 581-9988   °Julie Brannon, PhD [address] [place],    (336) 349-5553 or (336) 951-0000   °[facility]   [address] °[place], [place] (336) 349-4454   °[facility] [address], [place], [place] (336) 342-8316 Insurance/Medicaid/sponsorship through [facility]  °[facility] [address]                                    [place], [place] (336) 342-8316 Therapy/tele-psych/case  °[facility] [address]  ° [place], [place] (336) 349-2233    °Dr. Arfeen  (336) 349-4544   °[facility] [place]  [employer] [place] [facility]  Dept. 1) [address],  °2) [address], [place] °3)  [address], [place] (336) 349-3220 °(336) 342-7768 ° °(336) 342-8140   °[place] [facility] (336) 342-1394 or (336) 342-3537 (After Hours)    ° ° ° °

## 2015-12-10 ENCOUNTER — Encounter (HOSPITAL_COMMUNITY): Payer: Self-pay | Admitting: Emergency Medicine

## 2015-12-10 ENCOUNTER — Emergency Department (HOSPITAL_COMMUNITY)
Admission: EM | Admit: 2015-12-10 | Discharge: 2015-12-10 | Disposition: A | Discharge status: Home / Self Care | Payer: Self-pay | Attending: Emergency Medicine | Admitting: Emergency Medicine

## 2015-12-10 DIAGNOSIS — F329 Major depressive disorder, single episode, unspecified: Secondary | ICD-10-CM | POA: Insufficient documentation

## 2015-12-10 DIAGNOSIS — Z3202 Encounter for pregnancy test, result negative: Secondary | ICD-10-CM | POA: Insufficient documentation

## 2015-12-10 DIAGNOSIS — N921 Excessive and frequent menstruation with irregular cycle: Secondary | ICD-10-CM

## 2015-12-10 DIAGNOSIS — Z79899 Other long term (current) drug therapy: Secondary | ICD-10-CM | POA: Insufficient documentation

## 2015-12-10 DIAGNOSIS — R42 Dizziness and giddiness: Secondary | ICD-10-CM | POA: Insufficient documentation

## 2015-12-10 DIAGNOSIS — F419 Anxiety disorder, unspecified: Secondary | ICD-10-CM | POA: Insufficient documentation

## 2015-12-10 LAB — CBC WITH DIFFERENTIAL/PLATELET
Basophils Absolute: 0 K/uL (ref 0.0–0.1)
Basophils Relative: 0 %
Eosinophils Absolute: 0.1 K/uL (ref 0.0–0.7)
Eosinophils Relative: 1 %
HCT: 36.5 % (ref 36.0–46.0)
Hemoglobin: 12.2 g/dL (ref 12.0–15.0)
Lymphocytes Relative: 33 %
Lymphs Abs: 2.4 K/uL (ref 0.7–4.0)
MCH: 31.9 pg (ref 26.0–34.0)
MCHC: 33.4 g/dL (ref 30.0–36.0)
MCV: 95.3 fL (ref 78.0–100.0)
Monocytes Absolute: 0.7 K/uL (ref 0.1–1.0)
Monocytes Relative: 9 %
Neutro Abs: 4 K/uL (ref 1.7–7.7)
Neutrophils Relative %: 56 %
Platelets: 203 K/uL (ref 150–400)
RBC: 3.83 MIL/uL — ABNORMAL LOW (ref 3.87–5.11)
RDW: 13.8 % (ref 11.5–15.5)
WBC: 7.1 K/uL (ref 4.0–10.5)

## 2015-12-10 LAB — I-STAT BETA HCG BLOOD, ED (MC, WL, AP ONLY): I-stat hCG, quantitative: <5 m[IU]/mL

## 2015-12-10 LAB — I-STAT CHEM 8, ED
BUN: 15 mg/dL (ref 6–20)
Calcium, Ion: 1.17 mmol/L (ref 1.12–1.23)
Chloride: 103 mmol/L (ref 101–111)
Creatinine, Ser: 0.6 mg/dL (ref 0.44–1.00)
Glucose, Bld: 82 mg/dL (ref 65–99)
HCT: 41 % (ref 36.0–46.0)
Hemoglobin: 13.9 g/dL (ref 12.0–15.0)
Potassium: 3.9 mmol/L (ref 3.5–5.1)
Sodium: 141 mmol/L (ref 135–145)
TCO2: 24 mmol/L (ref 0–100)

## 2015-12-10 MED ORDER — NAPROXEN 375 MG PO TABS: 375.0000 mg | ORAL_TABLET | Freq: Two times a day (BID) | ORAL | Status: AC

## 2015-12-10 MED ORDER — KETOROLAC TROMETHAMINE 30 MG/ML IJ SOLN
30.0000 mg | Freq: Once | INTRAMUSCULAR | Status: AC
  Administered 2015-12-10: 30 mg via INTRAVENOUS
  Filled 2015-12-10: qty 1

## 2015-12-10 MED ORDER — SODIUM CHLORIDE 0.9 % IV BOLUS (SEPSIS)
500.0000 mL | Freq: Once | INTRAVENOUS | Status: AC
  Administered 2015-12-10: 500 mL via INTRAVENOUS

## 2015-12-10 NOTE — ED Provider Notes (Signed)
CSN: [id number]     Arrival date & time 12/10/15  [id number] History   First MD Initiated Contact with Patient 12/10/15 0747     Chief Complaint  Patient presents with  . Vaginal Bleeding  . Dizziness     (Consider location/radiation/quality/duration/timing/severity/associated sxs/prior Treatment) HPI  Jaime Henry is a 25 year old female with no significant past medical history who presents the ED today complaining of vaginal bleeding. Patient states that she is not had a menstrual cycle in 4 months until yesterday. Patient states her menses cycles very heavy and today she passed several blood clots. She also endorses significant lower abdominal cramping and dizziness. She denies syncope. She is not currently on OCPs. She is not tried taking anything for her symptoms. She denies sexual activity.  Past Medical History  Diagnosis Date  . Depression   . Anxiety    No past surgical history on file. No family history on file. Social History  Substance Use Topics  . Smoking status: Never Smoker   . Smokeless tobacco: None  . Alcohol Use: Yes     Comment: occasional   OB History    No data available     Review of Systems  All other systems reviewed and are negative.     Allergies  Review of patient's allergies indicates no known allergies.  Home Medications   Prior to Admission medications   Medication Sig Start Date End Date Taking? Authorizing Provider  albuterol (PROVENTIL HFA;VENTOLIN HFA) 108 (90 BASE) MCG/ACT inhaler Inhale 2 puffs into the lungs every 6 (six) hours as needed. For shortness of breath    Historical Provider, MD  ferrous gluconate (FERGON) 325 MG tablet Take 325 mg by mouth 3 (three) times daily with meals.      Historical Provider, MD  sertraline (ZOLOFT) 50 MG tablet Take 50 mg by mouth daily.    Historical Provider, MD  traZODone (DESYREL) 50 MG tablet Take 50 mg by mouth at bedtime as needed for sleep.    Historical Provider, MD   BP 116/61 mmHg   Pulse 72  Temp(Src) 98.3 F (36.8 C) (Oral)  Resp 18  Ht 5' 7.5" (1.715 m)  Wt 54.432 kg  BMI 18.51 kg/m2  SpO2 100%  LMP 08/12/2015 (LMP Unknown) Physical Exam  Constitutional: She is oriented to person, place, and time. She appears well-developed and well-nourished. No distress.  HENT:  Head: Normocephalic and atraumatic.  Eyes: Conjunctivae are normal. Right eye exhibits no discharge. Left eye exhibits no discharge. No scleral icterus.  Cardiovascular: Normal rate.   Pulmonary/Chest: Effort normal.  Abdominal: Soft. Bowel sounds are normal. She exhibits no distension and no mass. There is no tenderness. There is no rebound and no guarding.  Genitourinary: Uterus normal. Cervix exhibits no motion tenderness. Right adnexum displays no tenderness and no fullness. Left adnexum displays no tenderness and no fullness. There is bleeding in the vagina.  Gross blood on vaginal exam. Cervical os closed. No external genital lesions. Normal bimanual exam.  Neurological: She is alert and oriented to person, place, and time. Coordination normal.  Skin: Skin is warm and dry. No rash noted. She is not diaphoretic. No erythema. No pallor.  Psychiatric: She has a normal mood and affect. Her behavior is normal.  Nursing note and vitals reviewed.   ED Course  Procedures (including critical care time) Labs Review Labs Reviewed  CBC WITH DIFFERENTIAL/PLATELET  I-STAT BETA HCG BLOOD, ED ([facility], [facility], [facility] ONLY)  I-STAT CHEM 8, ED  Imaging Review No results found. I have personally reviewed and evaluated these images and lab results as part of my medical decision-making.   EKG Interpretation None      MDM   Final diagnoses:  Menorrhagia with irregular cycle   Otherwise healthy 25 year old female presents the ED today complaining of heavy vaginal bleeding with onset of her menstrual cycle yesterday with associated dizziness and abdominal cramping. Patient has irregular cycles and has not had a  period in 4 months until yesterday. She has passed a couple of clots today. She denies syncope or shortness of breath. Patient appears well in the, in no apparent distress. All vital signs are stable. No clinical signs of anemia. Hemoglobin stable at 12.2. HCG negative. Patient is not sexually active and denies any vaginal discharge or other GU symptoms. Patient given Toradol for cramping with some relief. As well as 500 mL of fluids. There is gross vaginal bleeding on pelvic exam. Cervical is closed. Patient's symptoms are consistent with menorrhagia. Discussed with patient that OCP therapy may be beneficial for her. Recommend follow-up with woman's health for evaluation. Patient may take home ibuprofen or Naprosyn for abdominal cramping. Patient is hemodynamically stable and ready for discharge. Return precautions outlined in patient discharge discharged.   Lester Kinsman [place], PA-C 12/10/15 1478  Alvira Monday, MD 12/11/15 1123

## 2015-12-10 NOTE — ED Notes (Signed)
Pt in from home with c/o heavy period, bad cramps and lightheadedness. Pt has not had period in 4 months, and yesterday she began bleeding heavily, with some quarter-sized clots. Felt weak and lightheaded, problems still persist.

## 2015-12-10 NOTE — Discharge Instructions (Signed)
Menorrhagia °Menorrhagia is the medical term for when your menstrual periods are heavy or last longer than usual. With menorrhagia, every period you have may cause enough blood loss and cramping that you are unable to maintain your usual activities. °CAUSES  °In some cases, the cause of heavy periods is unknown, but a number of conditions may cause menorrhagia. Common causes include: °· A problem with the hormone-producing thyroid gland (hypothyroid). °· Noncancerous growths in the uterus (polyps or fibroids). °· An imbalance of the estrogen and progesterone hormones. °· One of your ovaries not releasing an egg during one or more months. °· Side effects of having an intrauterine device (IUD). °· Side effects of some medicines, such as anti-inflammatory medicines or blood thinners. °· A bleeding disorder that stops your blood from clotting normally. °SIGNS AND SYMPTOMS  °During a normal period, bleeding lasts between 4 and 8 days. Signs that your periods are too heavy include: °· You routinely have to change your pad or tampon every 1 or 2 hours because it is completely soaked. °· You pass blood clots larger than 1 inch (2.5 cm) in size. °· You have bleeding for more than 7 days. °· You need to use pads and tampons at the same time because of heavy bleeding. °· You need to wake up to change your pads or tampons during the night. °· You have symptoms of anemia, such as tiredness, fatigue, or shortness of breath.  °DIAGNOSIS  °Your health care provider will perform a physical exam and ask you questions about your symptoms and menstrual history. Other tests may be ordered based on what the health care provider finds during the exam. These tests can include: °· Blood tests. Blood tests are used to check if you are pregnant or have hormonal changes, a bleeding or thyroid disorder, low iron levels (anemia), or other problems. °· Endometrial biopsy. Your health care provider takes a sample of tissue from the inside of your  uterus to be examined under a microscope. °· Pelvic ultrasound. This test uses sound waves to make a picture of your uterus, ovaries, and vagina. The pictures can show if you have fibroids or other growths. °· Hysteroscopy. For this test, your health care provider will use a small telescope to look inside your uterus. °Based on the results of your initial tests, your health care provider may recommend further testing. °TREATMENT  °Treatment may not be needed. If it is needed, your health care provider may recommend treatment with one or more medicines first. If these do not reduce bleeding enough, a surgical treatment might be an option. The best treatment for you will depend on:  °· Whether you need to prevent pregnancy.   °· Your desire to have children in the future. °· The cause and severity of your bleeding. °· Your opinion and personal preference.   °Medicines for menorrhagia may include: °· Birth control methods that use hormones. These include the pill, skin patch, vaginal ring, shots that you get every 3 months, hormonal IUD, and implant. These treatments reduce bleeding during your menstrual period. °· Medicines that thicken blood and slow bleeding. °· Medicines that reduce swelling, such as ibuprofen.  °· Medicines that contain a synthetic hormone called progestin.   °· Medicines that make the ovaries stop working for a short time.   °You may need surgical treatment for menorrhagia if the medicines are unsuccessful. Treatment options include: °· Dilation and curettage (D&C). In this procedure, your health care provider opens (dilates) your cervix and then scrapes or suctions tissue from   the lining of your uterus to reduce menstrual bleeding.  Operative hysteroscopy. This procedure uses a tiny tube with a light (hysteroscope) to view your uterine cavity and can help in the surgical removal of a polyp that may be causing heavy periods.  Endometrial ablation. Through various techniques, your health care  provider permanently destroys the entire lining of your uterus (endometrium). After endometrial ablation, most women have little or no menstrual flow. Endometrial ablation reduces your ability to become pregnant.  Endometrial resection. This surgical procedure uses an electrosurgical wire loop to remove the lining of the uterus. This procedure also reduces your ability to become pregnant.  Hysterectomy. Surgical removal of the uterus and cervix is a permanent procedure that stops menstrual periods. Pregnancy is not possible after a hysterectomy. This procedure requires anesthesia and hospitalization. HOME CARE INSTRUCTIONS   Only take over-the-counter or prescription medicines as directed by your health care provider. Take prescribed medicines exactly as directed. Do not change or switch medicines without consulting your health care provider.  Take any prescribed iron pills exactly as directed by your health care provider. Long-term heavy bleeding may result in low iron levels. Iron pills help replace the iron your body lost from heavy bleeding. Iron may cause constipation. If this becomes a problem, increase the bran, fruits, and roughage in your diet.  Do not take aspirin or medicines that contain aspirin 1 week before or during your menstrual period. Aspirin may make the bleeding worse.  If you need to change your sanitary pad or tampon more than once every 2 hours, stay in bed and rest as much as possible until the bleeding stops.  Eat well-balanced meals. Eat foods high in iron. Examples are leafy green vegetables, meat, liver, eggs, and whole grain breads and cereals. Do not try to lose weight until the abnormal bleeding has stopped and your blood iron level is back to normal. SEEK MEDICAL CARE IF:   You soak through a pad or tampon every 1 or 2 hours, and this happens every time you have a period.  You need to use pads and tampons at the same time because you are bleeding so much.  You  need to change your pad or tampon during the night.  You have a period that lasts for more than 8 days.  You pass clots bigger than 1 inch wide.  You have irregular periods that happen more or less often than once a month.  You feel dizzy or faint.  You feel very weak or tired.  You feel short of breath or feel your heart is beating too fast when you exercise.  You have nausea and vomiting or diarrhea while you are taking your medicine.  You have any problems that may be related to the medicine you are taking. SEEK IMMEDIATE MEDICAL CARE IF:   You soak through 4 or more pads or tampons in 2 hours.  You have any bleeding while you are pregnant. MAKE SURE YOU:   Understand these instructions.  Will watch your condition.  Will get help right away if you are not doing well or get worse.   This information is not intended to replace advice given to you by your health care provider. Make sure you discuss any questions you have with your health care provider.   Follow-up with an OB/GYN for reevaluation. You will likely benefit from oral contraceptives and help to regulate her menstrual cycles and relieve your abdominal cramping. You may take Naprosyn or ibuprofen as  needed for abdominal cramping. Stay adequately hydrated and drink plenty of fluids. Return to the ED if you experience loss of consciousness, chest pain or shortness of breath, severe abdominal pain, vomiting.

## 2016-01-20 ENCOUNTER — Encounter: Payer: Self-pay | Admitting: Obstetrics & Gynecology

## 2020-01-28 ENCOUNTER — Encounter: Payer: Self-pay | Admitting: Family Medicine

## 2020-04-28 ENCOUNTER — Ambulatory Visit: Payer: Self-pay
